# Patient Record
Sex: Female | Born: 1965 | Race: White | Hispanic: No | Smoking: Never smoker
Health system: Southern US, Community
[De-identification: ages and names within clinical notes are randomized; demographics above are authoritative.]

## PROBLEM LIST (undated history)

## (undated) DIAGNOSIS — E119 Type 2 diabetes mellitus without complications: Secondary | ICD-10-CM

## (undated) DIAGNOSIS — I10 Essential (primary) hypertension: Secondary | ICD-10-CM

---

## 2006-04-05 ENCOUNTER — Ambulatory Visit: Payer: Self-pay | Admitting: Internal Medicine

## 2007-08-15 ENCOUNTER — Emergency Department: Payer: Self-pay | Admitting: Emergency Medicine

## 2008-04-30 ENCOUNTER — Emergency Department: Payer: Self-pay | Admitting: Emergency Medicine

## 2008-07-15 ENCOUNTER — Emergency Department: Payer: Self-pay | Admitting: Emergency Medicine

## 2008-07-15 ENCOUNTER — Other Ambulatory Visit: Payer: Self-pay

## 2010-09-14 ENCOUNTER — Ambulatory Visit: Payer: Self-pay | Admitting: Nephrology

## 2011-05-22 IMAGING — US TRANSABDOMINAL ULTRASOUND OF PELVIS
1 series · 17 of 25 positions shown · non-contrast
Comparison: none

REASON FOR EXAM: menorrhea
COMMENTS:

[Series 1: transabdominal ultrasound of pelvis · 17 of 59 slices shown]
[im 1/59]
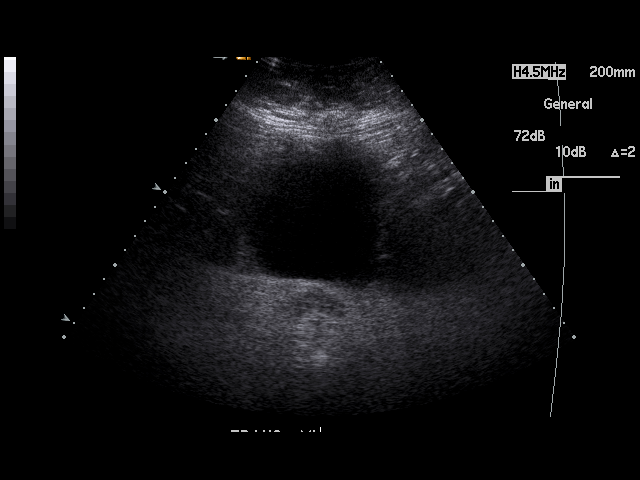
[im 5/59]
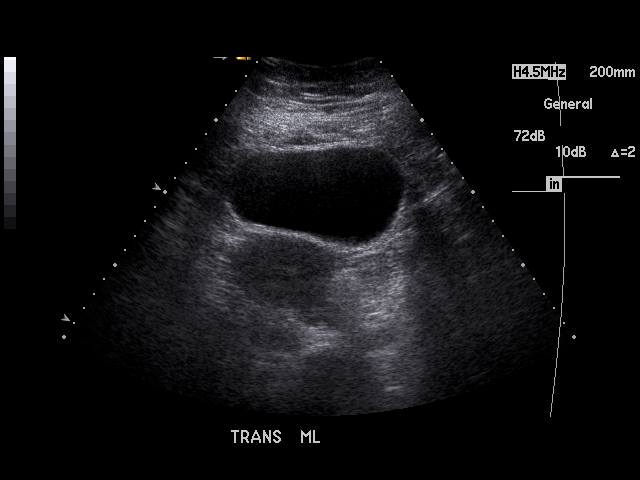
[im 8/59]
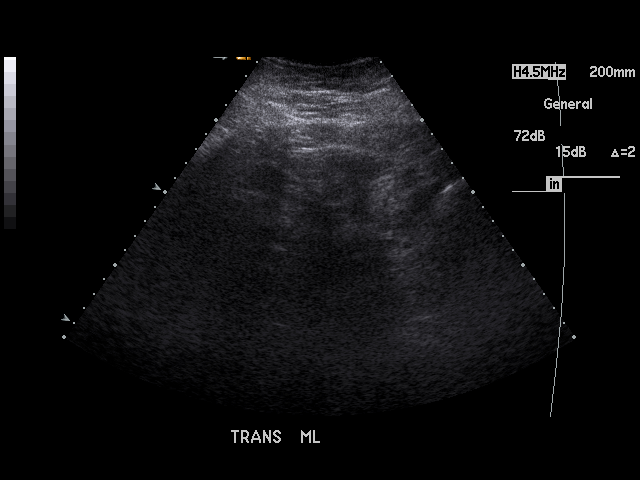
[im 13/59]
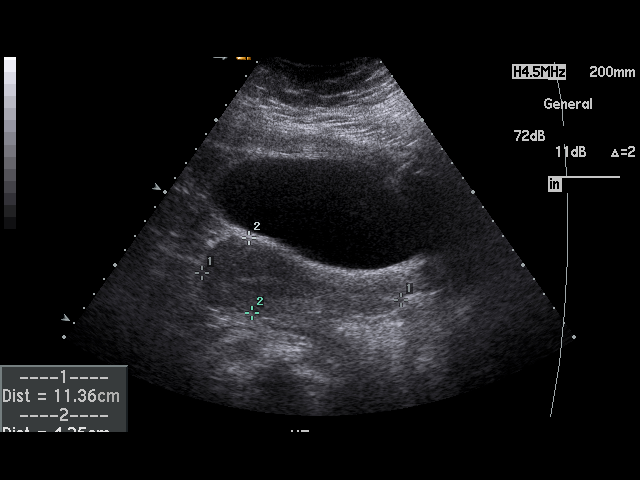
[im 15/59]
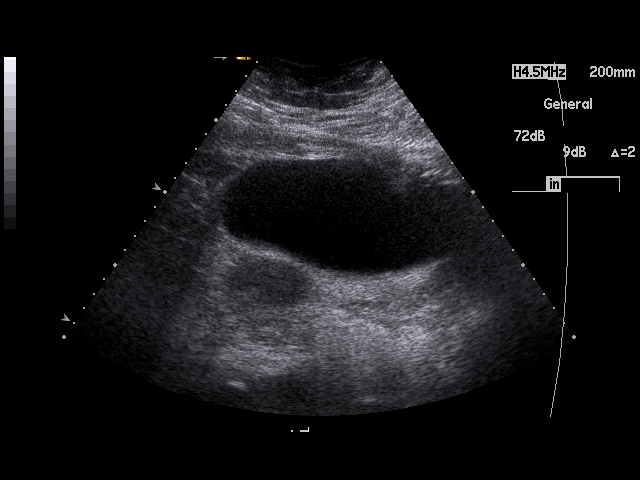
[im 20/59]
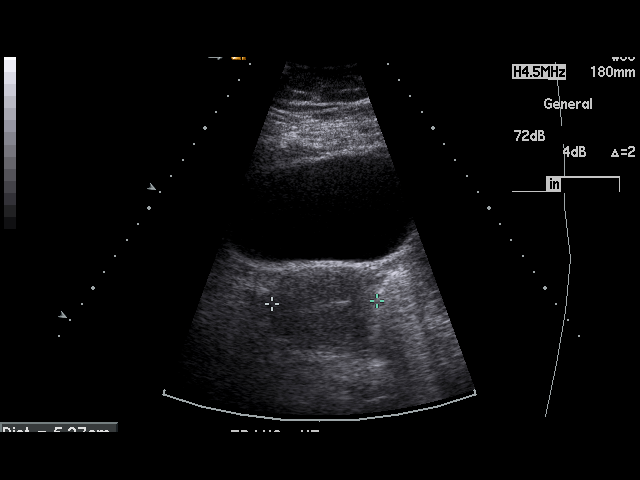
[im 22/59]
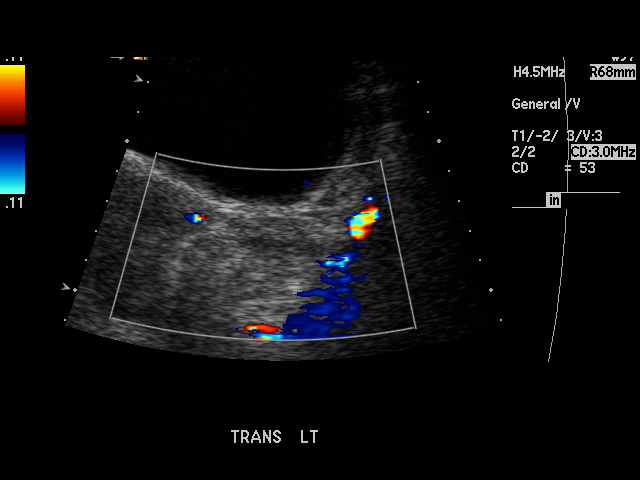
[im 27/59]
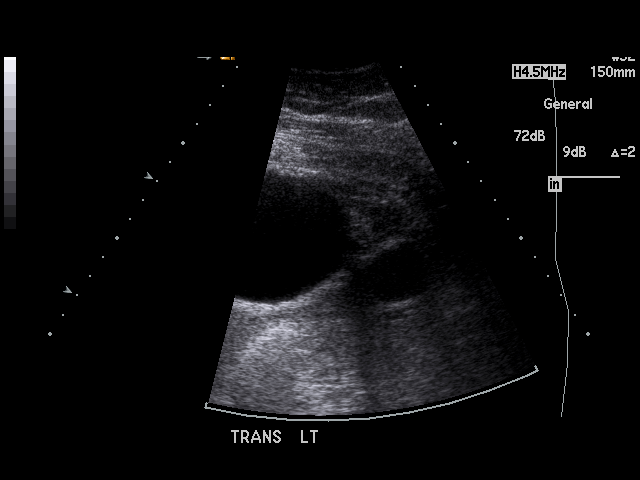
[im 30/59]
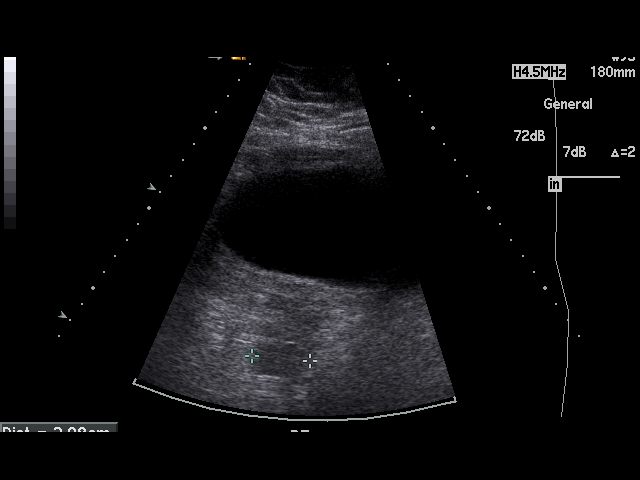
[im 32/59]
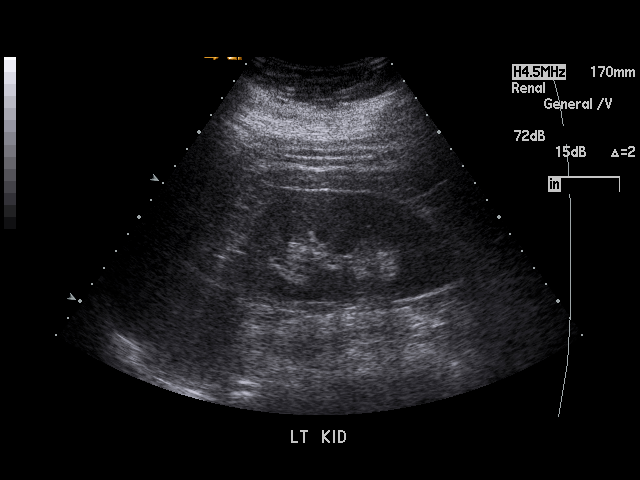
[im 37/59]
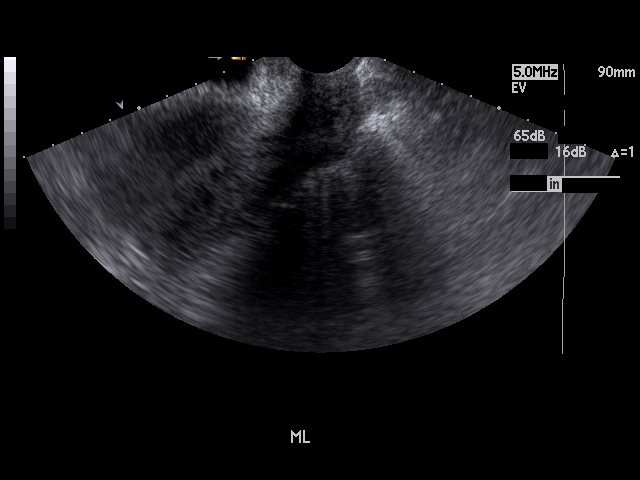
[im 39/59]
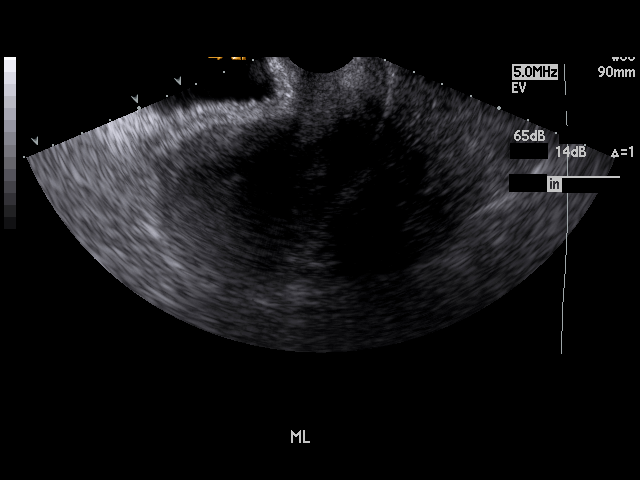
[im 44/59]
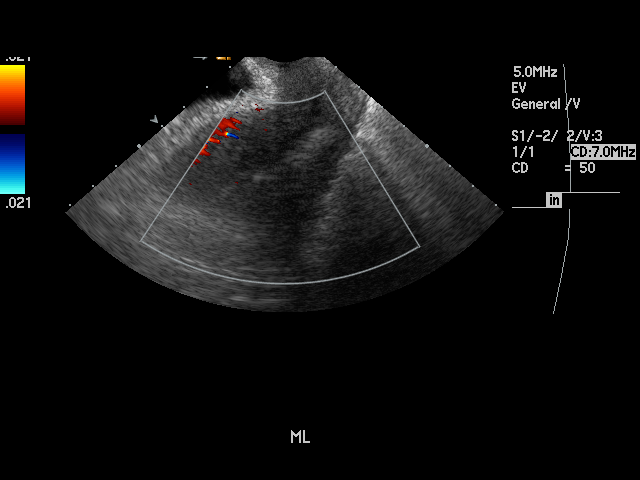
[im 46/59]
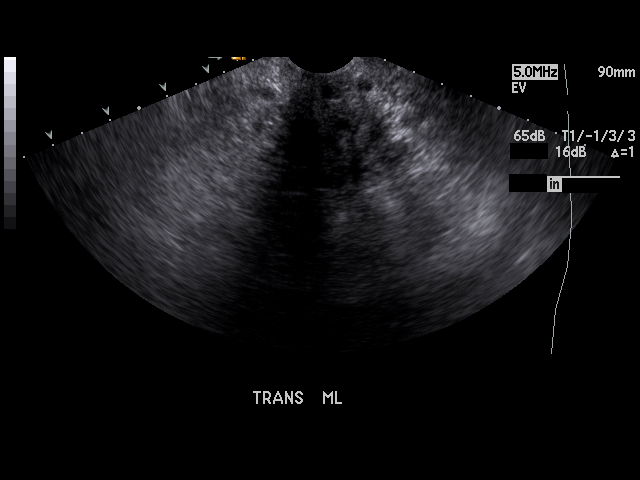
[im 51/59]
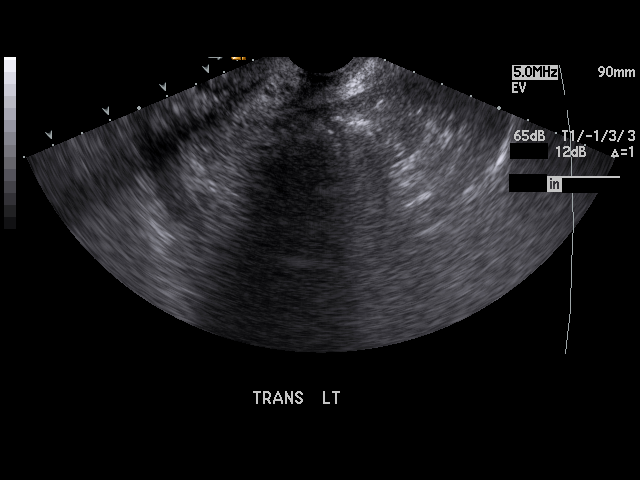
[im 54/59]
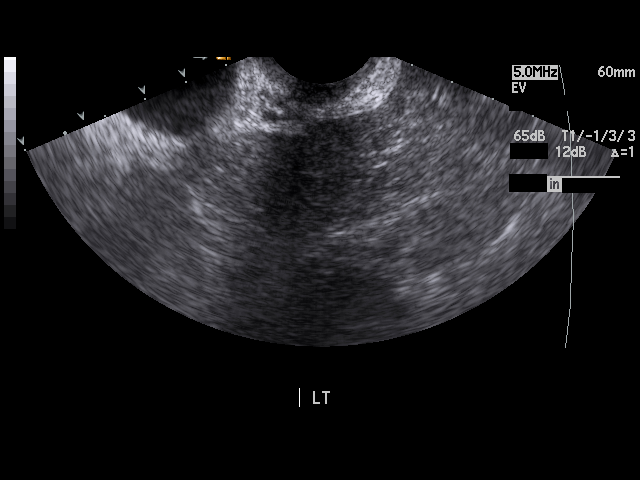
[im 59/59]
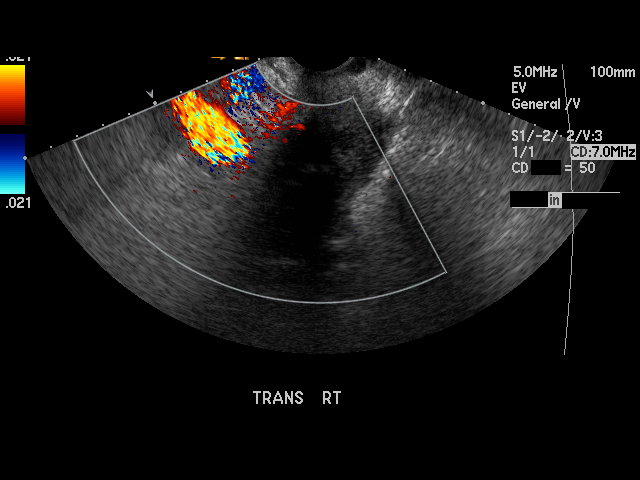

[17 of 25 positions shown; findings below may reference images not displayed]

PROCEDURE:     M[facility]CO - M[facility]ENCO PELVIS NON-OB W/TRANSVAGINAL  - September 14, 2010 [DATE]

RESULT:     Transabdominal and endovaginal ultrasound was performed. The
uterus measures 11.36 cm x 4.25 cm x 5.37 cm. No uterine mass lesions are
seen. The endometrium measures 11 mm in thickness. A nabothian cyst of the
cervix is observed.

The left ovary measures 2.98 cm at maximum diameter. What appears to be the
left ovary measures 2.71 cm at maximum diameter. There is a 2.48 cm cyst in
the left adnexal area. On this exam the finding is not shown definitely to
be in relation to the ovary. Certainly, a left ovarian cyst would be the
first consideration but a non-ovarian cyst cannot be excluded. No free fluid
is seen at the pelvis. The visualized portion of the urinary bladder is
normal in appearance. The kidneys show no hydronephrosis.
IMPRESSION: 1. Sonographic evaluation of the pelvis is somewhat limited, apparently due
to body habitus.
2. No uterine mass lesions are seen except for a nabothian cyst.
3. The ovaries are not well seen but what appear to represent the ovaries
are of normal size.
4. There is a 2.48 cm left adnexal cyst that could represent an exophytic
ovarian cyst but this is not definite.
5. No free fluid is seen in the pelvis.
6. The kidneys show no hydronephrosis.
7. The endometrium measures 11 mm in thickness.

## 2012-06-27 ENCOUNTER — Emergency Department: Payer: Self-pay | Admitting: Emergency Medicine

## 2012-06-27 LAB — CBC
HCT: 36.5 % (ref 35.0–47.0)
HGB: 12 g/dL (ref 12.0–16.0)
MCV: 88 fL (ref 80–100)
RDW: 16.9 % — ABNORMAL HIGH (ref 11.5–14.5)
WBC: 8.8 10*3/uL (ref 3.6–11.0)

## 2012-06-27 LAB — BASIC METABOLIC PANEL
Anion Gap: 4 — ABNORMAL LOW (ref 7–16)
BUN: 11 mg/dL (ref 7–18)
Chloride: 105 mmol/L (ref 98–107)
Co2: 31 mmol/L (ref 21–32)
Creatinine: 0.88 mg/dL (ref 0.60–1.30)
EGFR (Non-African Amer.): >60
Glucose: 127 mg/dL — ABNORMAL HIGH (ref 65–99)
Potassium: 3.9 mmol/L (ref 3.5–5.1)
Sodium: 140 mmol/L (ref 136–145)

## 2012-06-27 LAB — CK TOTAL AND CKMB (NOT AT ARMC): CK-MB: 0.6 ng/mL (ref 0.5–3.6)

## 2014-10-25 ENCOUNTER — Emergency Department: Payer: Self-pay | Admitting: Student

## 2015-09-24 ENCOUNTER — Encounter: Payer: Self-pay | Admitting: *Deleted

## 2015-09-24 ENCOUNTER — Emergency Department
Admission: EM | Admit: 2015-09-24 | Discharge: 2015-09-24 | Disposition: A | Discharge status: Home / Self Care | Payer: Self-pay | Attending: Emergency Medicine | Admitting: Emergency Medicine

## 2015-09-24 ENCOUNTER — Emergency Department: Payer: Self-pay

## 2015-09-24 DIAGNOSIS — E119 Type 2 diabetes mellitus without complications: Secondary | ICD-10-CM | POA: Insufficient documentation

## 2015-09-24 DIAGNOSIS — M1712 Unilateral primary osteoarthritis, left knee: Secondary | ICD-10-CM | POA: Insufficient documentation

## 2015-09-24 DIAGNOSIS — Y9289 Other specified places as the place of occurrence of the external cause: Secondary | ICD-10-CM | POA: Insufficient documentation

## 2015-09-24 DIAGNOSIS — Z880 Allergy status to penicillin: Secondary | ICD-10-CM | POA: Insufficient documentation

## 2015-09-24 DIAGNOSIS — S86912A Strain of unspecified muscle(s) and tendon(s) at lower leg level, left leg, initial encounter: Secondary | ICD-10-CM | POA: Insufficient documentation

## 2015-09-24 DIAGNOSIS — I10 Essential (primary) hypertension: Secondary | ICD-10-CM | POA: Insufficient documentation

## 2015-09-24 DIAGNOSIS — Y9301 Activity, walking, marching and hiking: Secondary | ICD-10-CM | POA: Insufficient documentation

## 2015-09-24 DIAGNOSIS — X58XXXA Exposure to other specified factors, initial encounter: Secondary | ICD-10-CM | POA: Insufficient documentation

## 2015-09-24 DIAGNOSIS — Y998 Other external cause status: Secondary | ICD-10-CM | POA: Insufficient documentation

## 2015-09-24 HISTORY — DX: Type 2 diabetes mellitus without complications: E11.9

## 2015-09-24 HISTORY — DX: Essential (primary) hypertension: I10

## 2015-09-24 MED ORDER — IBUPROFEN 800 MG PO TABS: 800.0000 mg | ORAL_TABLET | Freq: Three times a day (TID) | ORAL | Status: AC | PRN

## 2015-09-24 MED ORDER — OXYCODONE-ACETAMINOPHEN 5-325 MG PO TABS: 1.0000 | ORAL_TABLET | ORAL | Status: AC | PRN

## 2015-09-24 MED ORDER — KETOROLAC TROMETHAMINE 60 MG/2ML IM SOLN
60.0000 mg | Freq: Once | INTRAMUSCULAR | Status: AC
  Administered 2015-09-24: 60 mg via INTRAMUSCULAR
  Filled 2015-09-24: qty 2

## 2015-09-24 MED ORDER — HYDROCODONE-ACETAMINOPHEN 5-325 MG PO TABS
2.0000 | ORAL_TABLET | Freq: Once | ORAL | Status: AC
  Administered 2015-09-24: 2 via ORAL
  Filled 2015-09-24: qty 2

## 2015-09-24 NOTE — Discharge Instructions (Signed)
Cryotherapy °Cryotherapy means treatment with cold. Ice or gel packs can be used to reduce both pain and swelling. Ice is the most helpful within the first 24 to 48 hours after an injury or flare-up from overusing a muscle or joint. Sprains, strains, spasms, burning pain, shooting pain, and aches can all be eased with ice. Ice can also be used when recovering from surgery. Ice is effective, has very few side effects, and is safe for most people to use. °PRECAUTIONS  °Ice is not a safe treatment option for people with: °· Raynaud phenomenon. This is a condition affecting small blood vessels in the extremities. Exposure to cold may cause your problems to return. °· Cold hypersensitivity. There are many forms of cold hypersensitivity, including: °· Cold urticaria. Red, itchy hives appear on the skin when the tissues begin to warm after being iced. °· Cold erythema. This is a red, itchy rash caused by exposure to cold. °· Cold hemoglobinuria. Red blood cells break down when the tissues begin to warm after being iced. The hemoglobin that carry oxygen are passed into the urine because they cannot combine with blood proteins fast enough. °· Numbness or altered sensitivity in the area being iced. °If you have any of the following conditions, do not use ice until you have discussed cryotherapy with your caregiver: °· Heart conditions, such as arrhythmia, angina, or chronic heart disease. °· High blood pressure. °· Healing wounds or open skin in the area being iced. °· Current infections. °· Rheumatoid arthritis. °· Poor circulation. °· Diabetes. °Ice slows the blood flow in the region it is applied. This is beneficial when trying to stop inflamed tissues from spreading irritating chemicals to surrounding tissues. However, if you expose your skin to cold temperatures for too long or without the proper protection, you can damage your skin or nerves. Watch for signs of skin damage due to cold. °HOME CARE INSTRUCTIONS °Follow  these tips to use ice and cold packs safely. °· Place a dry or damp towel between the ice and skin. A damp towel will cool the skin more quickly, so you may need to shorten the time that the ice is used. °· For a more rapid response, add gentle compression to the ice. °· Ice for no more than 10 to 20 minutes at a time. The bonier the area you are icing, the less time it will take to get the benefits of ice. °· Check your skin after 5 minutes to make sure there are no signs of a poor response to cold or skin damage. °· Rest 20 minutes or more between uses. °· Once your skin is numb, you can end your treatment. You can test numbness by very lightly touching your skin. The touch should be so light that you do not see the skin dimple from the pressure of your fingertip. When using ice, most people will feel these normal sensations in this order: cold, burning, aching, and numbness. °· Do not use ice on someone who cannot communicate their responses to pain, such as small children or people with dementia. °HOW TO MAKE AN ICE PACK °Ice packs are the most common way to use ice therapy. Other methods include ice massage, ice baths, and cryosprays. Muscle creams that cause a cold, tingly feeling do not offer the same benefits that ice offers and should not be used as a substitute unless recommended by your caregiver. °To make an ice pack, do one of the following: °· Place crushed ice or a   bag of frozen vegetables in a sealable plastic bag. Squeeze out the excess air. Place this bag inside another plastic bag. Slide the bag into a pillowcase or place a damp towel between your skin and the bag.  Mix 3 parts water with 1 part rubbing alcohol. Freeze the mixture in a sealable plastic bag. When you remove the mixture from the freezer, it will be slushy. Squeeze out the excess air. Place this bag inside another plastic bag. Slide the bag into a pillowcase or place a damp towel between your skin and the bag. SEEK MEDICAL CARE  IF:  You develop white spots on your skin. This may give the skin a blotchy (mottled) appearance.  Your skin turns blue or pale.  Your skin becomes waxy or hard.  Your swelling gets worse. MAKE SURE YOU:   Understand these instructions.  Will watch your condition.  Will get help right away if you are not doing well or get worse.   This information is not intended to replace advice given to you by your health care provider. Make sure you discuss any questions you have with your health care provider.   Document Released: 06/19/2011 Document Revised: 11/13/2014 Document Reviewed: 06/19/2011 Elsevier Interactive Patient Education 2016 [employer]  Osteoarthritis Osteoarthritis is a disease that causes soreness and inflammation of a joint. It occurs when the cartilage at the affected joint wears down. Cartilage acts as a cushion, covering the ends of bones where they meet to form a joint. Osteoarthritis is the most common form of arthritis. It often occurs in older people. The joints affected most often by this condition include those in the:  Ends of the fingers.  Thumbs.  Neck.  Lower back.  Knees.  Hips. CAUSES  Over time, the cartilage that covers the ends of bones begins to wear away. This causes bone to rub on bone, producing pain and stiffness in the affected joints.  RISK FACTORS Certain factors can increase your chances of having osteoarthritis, including:  Older age.  Excessive body weight.  Overuse of joints.  Previous joint injury. SIGNS AND SYMPTOMS   Pain, swelling, and stiffness in the joint.  Over time, the joint may lose its normal shape.  Small deposits of bone (osteophytes) may grow on the edges of the joint.  Bits of bone or cartilage can break off and float inside the joint space. This may cause more pain and damage. DIAGNOSIS  Your health care provider will do a physical exam and ask about your symptoms. Various tests may be ordered, such  as:  X-rays of the affected joint.  Blood tests to rule out other types of arthritis. Additional tests may be used to diagnose your condition. TREATMENT  Goals of treatment are to control pain and improve joint function. Treatment plans may include:  A prescribed exercise program that allows for rest and joint relief.  A weight control plan.  Pain relief techniques, such as:  Properly applied heat and cold.  Electric pulses delivered to nerve endings under the skin (transcutaneous electrical nerve stimulation [TENS]).  Massage.  Certain nutritional supplements.  Medicines to control pain, such as:  Acetaminophen.  Nonsteroidal anti-inflammatory drugs (NSAIDs), such as naproxen.  Narcotic or central-acting agents, such as tramadol.  Corticosteroids. These can be given orally or as an injection.  Surgery to reposition the bones and relieve pain (osteotomy) or to remove loose pieces of bone and cartilage. Joint replacement may be needed in advanced states of osteoarthritis. HOME CARE INSTRUCTIONS  Take medicines only as directed by your health care provider.  Maintain a healthy weight. Follow your health care provider's instructions for weight control. This may include dietary instructions.  Exercise as directed. Your health care provider can recommend specific types of exercise. These may include:  Strengthening exercises. These are done to strengthen the muscles that support joints affected by arthritis. They can be performed with weights or with exercise bands to add resistance.  Aerobic activities. These are exercises, such as brisk walking or low-impact aerobics, that get your heart pumping.  Range-of-motion activities. These keep your joints limber.  Balance and agility exercises. These help you maintain daily living skills.  Rest your affected joints as directed by your health care provider.  Keep all follow-up visits as directed by your health care  provider. SEEK MEDICAL CARE IF:   Your skin turns red.  You develop a rash in addition to your joint pain.  You have worsening joint pain.  You have a fever along with joint or muscle aches. SEEK IMMEDIATE MEDICAL CARE IF:  You have a significant loss of weight or appetite.  You have night sweats. FOR MORE INFORMATION   National Institute of Arthritis and Musculoskeletal and Skin Diseases: www.niams.http://www.myers.net/  [employer] on Aging: https://walker.com/  American College of Rheumatology: www.rheumatology.org   This information is not intended to replace advice given to you by your health care provider. Make sure you discuss any questions you have with your health care provider.   Document Released: 10/23/2005 Document Revised: 11/13/2014 Document Reviewed: 06/30/2013 Elsevier Interactive Patient Education [employer]

## 2015-09-24 NOTE — ED Provider Notes (Signed)
[facility] Emergency Department Provider Note  ____________________________________________  Time seen: Approximately 2:13 PM  I have reviewed the triage vital signs and the nursing notes.   HISTORY  Chief Complaint Knee Pain    HPI Bonnie Newman is a 49 y.o. female who presents for evaluation of left knee pain. Patient states that [place] when the knee gave out complaining of increased pain.   Past Medical History  Diagnosis Date  . Hypertension   . Diabetes mellitus without complication (HCC)     There are no active problems to display for this patient.   History reviewed. No pertinent past surgical history.  Current Outpatient Rx  Name  Route  Sig  Dispense  Refill  . ibuprofen (ADVIL,MOTRIN) 800 MG tablet   Oral   Take 1 tablet (800 mg total) by mouth every 8 (eight) hours as needed.   30 tablet   0   . oxyCODONE-acetaminophen (ROXICET) 5-325 MG tablet   Oral   Take 1-2 tablets by mouth every 4 (four) hours as needed for severe pain.   15 tablet   0     Allergies Codeine and Penicillins  No family history on file.  Social History Social History  Substance Use Topics  . Smoking status: Never Smoker   . Smokeless tobacco: None  . Alcohol Use: No    Review of Systems Constitutional: No fever/chills Eyes: No visual changes. ENT: No sore throat. Cardiovascular: Denies chest pain. Respiratory: Denies shortness of breath. Gastrointestinal: No abdominal pain.  No nausea, no vomiting.  No diarrhea.  No constipation. Genitourinary: Negative for dysuria. Musculoskeletal: Positive for left knee pain. Skin: Negative for rash. Neurological: Negative for headaches, focal weakness or numbness.  10-point ROS otherwise negative.  ____________________________________________   PHYSICAL EXAM:  VITAL SIGNS: ED Triage Vitals  Enc Vitals Group     BP 09/24/15 1338 118/58 mmHg     Pulse Rate  09/24/15 1338 83     Resp 09/24/15 1338 20     Temp 09/24/15 1338 97.8 F (36.6 C)     Temp Source 09/24/15 1338 Oral     SpO2 09/24/15 1338 95 %     Weight 09/24/15 1338 279 lb (126.554 kg)     Height 09/24/15 1338 5\' 1"  (1.549 m)     Head Cir --      Peak Flow --      Pain Score 09/24/15 1339 5     Pain Loc --      Pain Edu? --      Excl. in GC? --     Constitutional: Alert and oriented. Well appearing and in no acute distress.  Cardiovascular: Normal rate, regular rhythm. Grossly normal heart sounds.  Good peripheral circulation. Respiratory: Normal respiratory effort.  No retractions. Lungs CTAB. Musculoskeletal: No lower extremity tenderness nor edema.  No joint effusions. Neurologic:  Normal speech and language. No gross focal neurologic deficits are appreciated. No gait instability. Skin:  Skin is warm, dry and intact. No rash noted. Psychiatric: Mood and affect are normal. Speech and behavior are normal.  ____________________________________________   LABS (all labs ordered are listed, but only abnormal results are displayed)  Labs Reviewed - No data to display ____________________________________________  RADIOLOGY  IMPRESSION: Mild tricompartmental degenerative joint disease. No acute abnormality seen in the left knee. ____________________________________________   PROCEDURES  Procedure(s) performed: None  Critical Care performed: No  ____________________________________________   INITIAL IMPRESSION / ASSESSMENT  AND PLAN / ED COURSE  Pertinent labs & imaging results that were available during my care of the patient were reviewed by me and considered in my medical decision making (see chart for details).  Status post fall with acute left knee contusion/strain with osteoarthritis noted in the knee. Rx given for Motrin 800 mg 3 times a day and oxycodone 5/325 as needed for pain. Patient to rest and ice and elevate this weekend and follow up with PCP or  orthopedics on call if needed. Patient voices no other emergency medical complaints at this time. ____________________________________________   FINAL CLINICAL IMPRESSION(S) / ED DIAGNOSES  Final diagnoses:  Knee strain, left, initial encounter  Osteoarthritis of left knee, unspecified osteoarthritis type      Evangeline Dakin, PA-C 09/24/15 1521  Jeanmarie Plant, MD 09/24/15 [phone]

## 2015-09-24 NOTE — ED Notes (Signed)
Pt reports left knee pain for 1 week, pt was walking up steps today and knee" gave out" per pt

## 2016-05-31 IMAGING — CR DG KNEE COMPLETE 4+V*L*
1 series · 4 of 4 positions shown · non-contrast
Comparison: None.

CLINICAL DATA: Acute left knee pain without injury.

EXAM:
LEFT KNEE - COMPLETE 4+ VIEW

[Series 1: ap · 0.17mm/px · 4 of 4 slices shown]
[im 1/4]
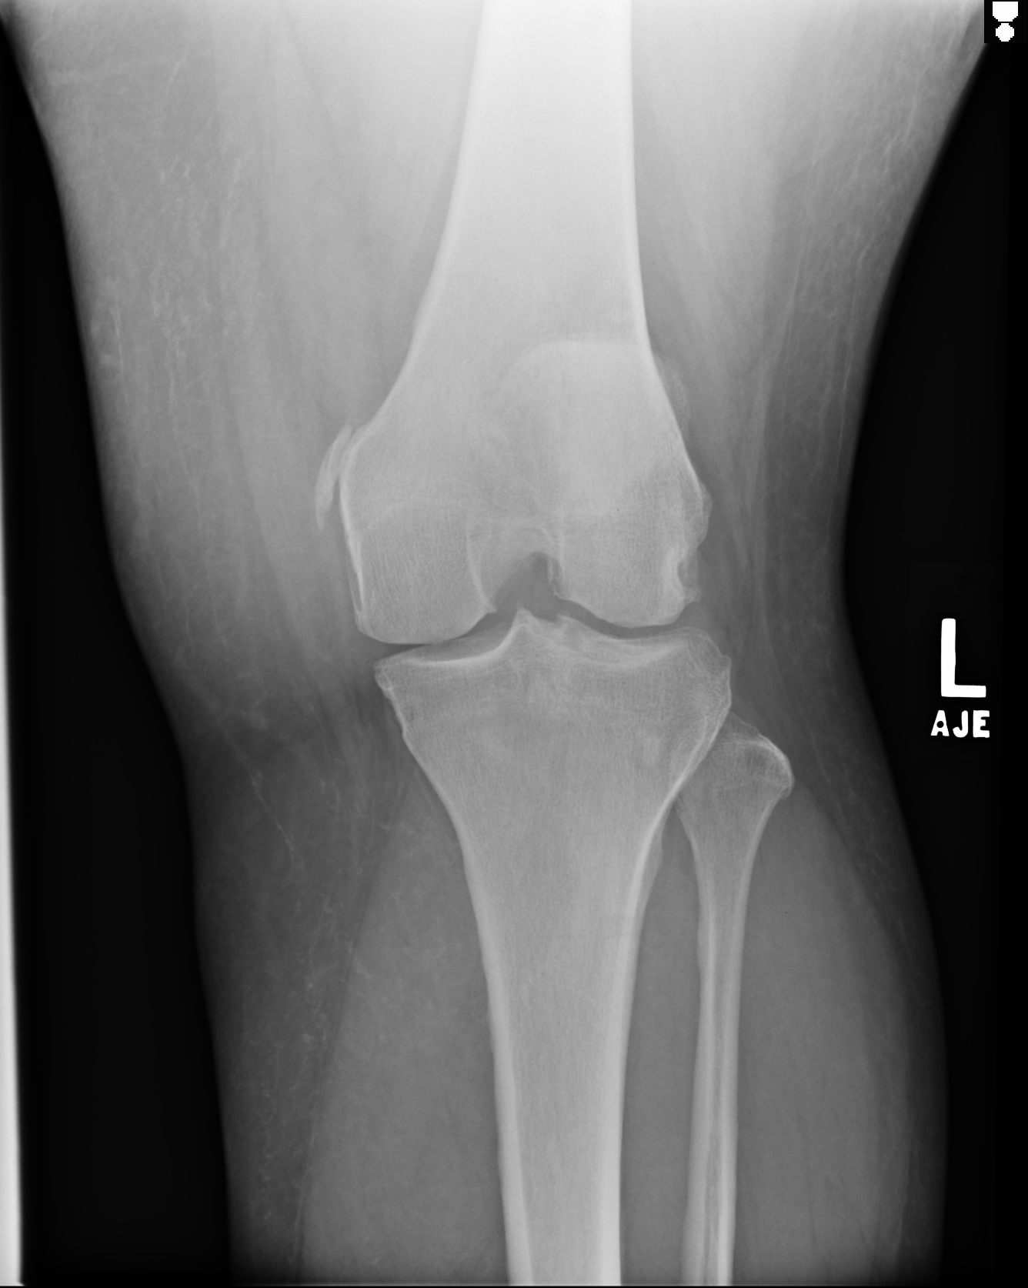
[im 2/4]
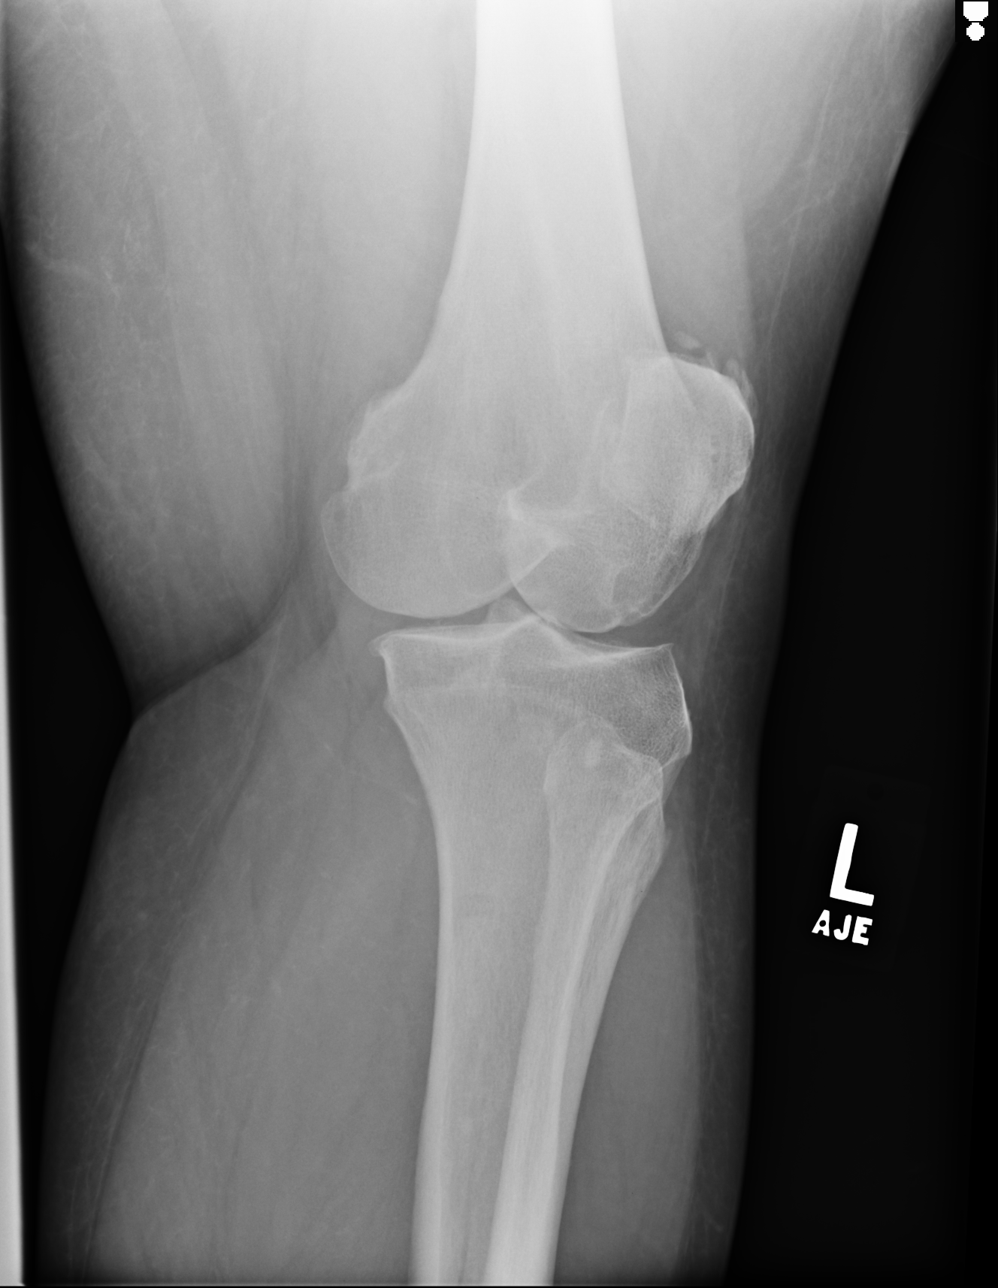
[im 3/4]
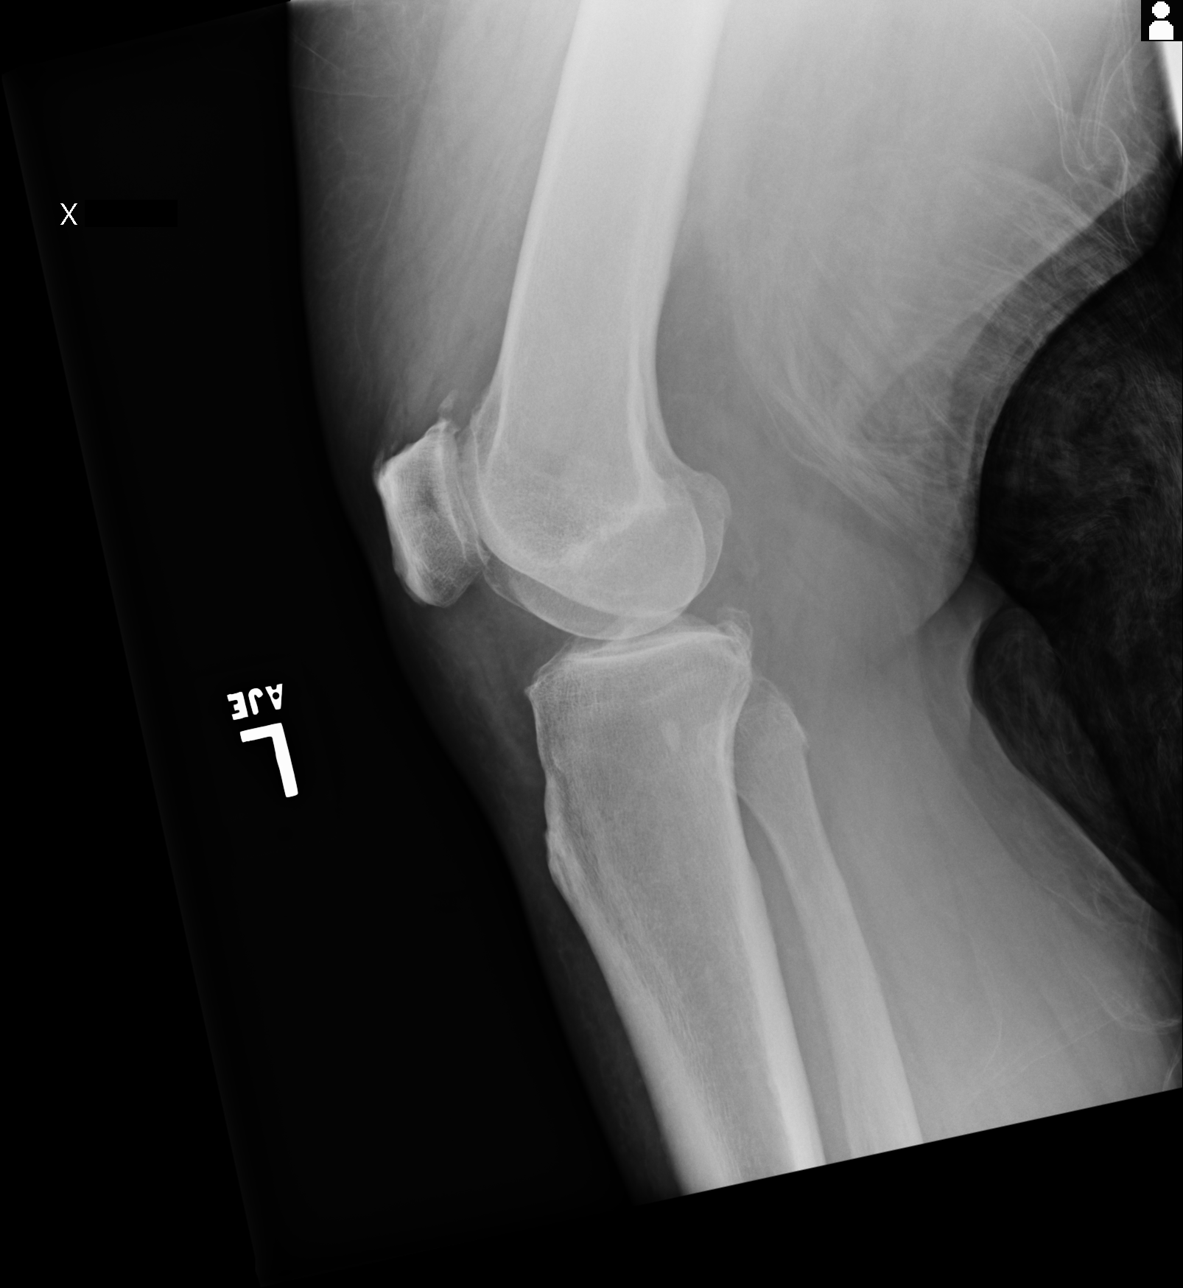
[im 4/4]
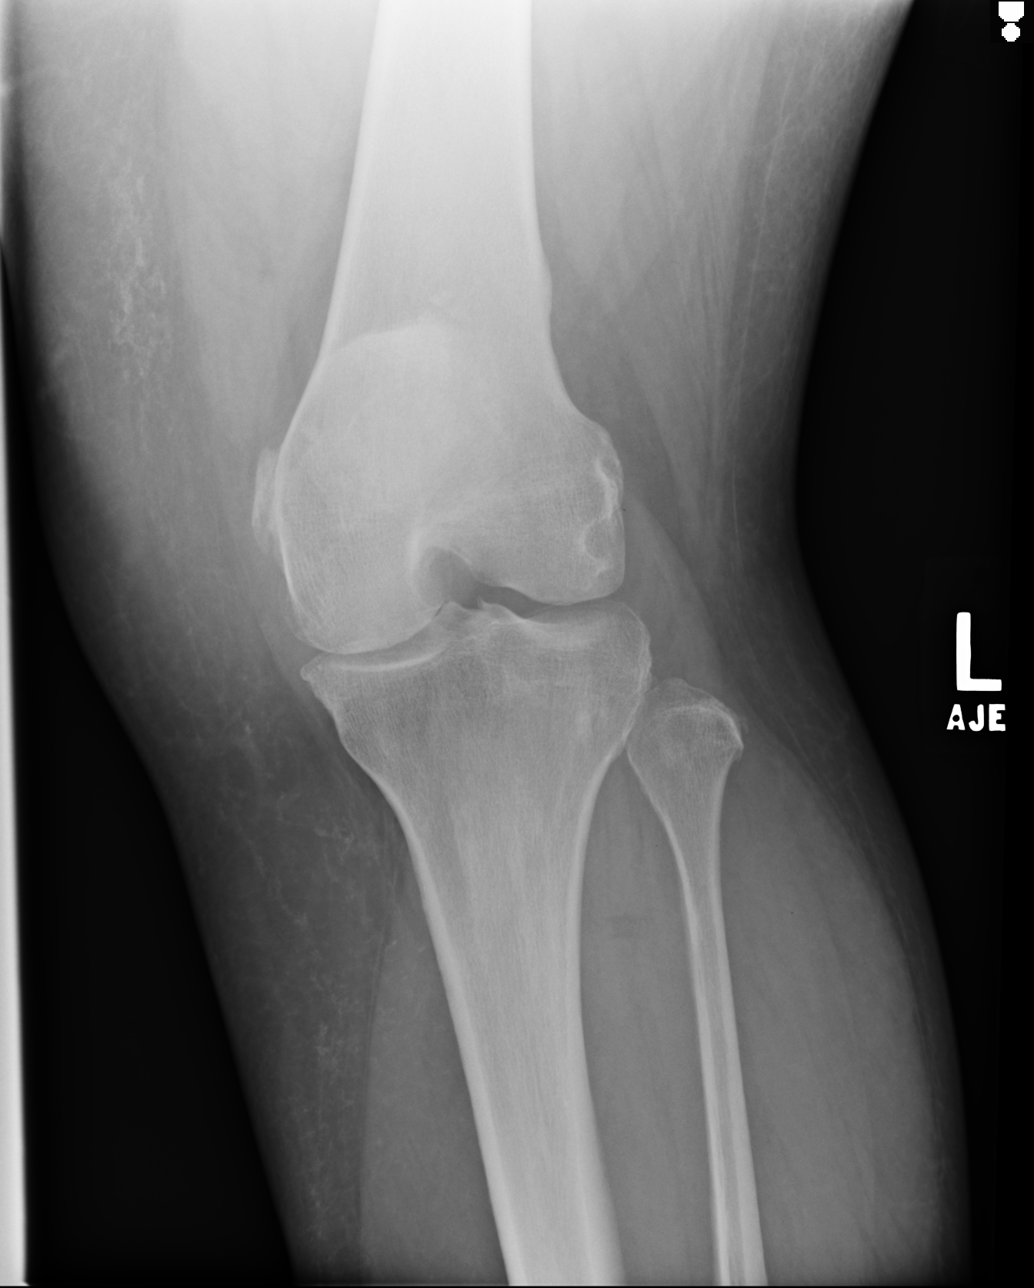

[4 of 4 positions shown; findings below may reference images not displayed]

FINDINGS: There is no evidence of fracture, dislocation, or joint effusion.
Mild narrowing of patellofemoral space is noted. Mild narrowing of
the medial and lateral joint spaces is noted. Soft tissues are
unremarkable.
IMPRESSION: Mild tricompartmental degenerative joint disease. No acute
abnormality seen in the left knee.
